# Patient Record
Sex: Female | Born: 1990 | Race: White | Hispanic: No | Marital: Single | Smoking: Current every day smoker
Health system: Southern US, Community
[De-identification: ages and names within clinical notes are randomized; demographics above are authoritative.]

## PROBLEM LIST (undated history)

## (undated) HISTORY — PX: TONSILLECTOMY: SUR1361

---
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
## 2010-07-20 ENCOUNTER — Emergency Department: Payer: Self-pay | Admitting: Emergency Medicine

## 2012-12-18 ENCOUNTER — Emergency Department: Payer: Self-pay | Admitting: Emergency Medicine

## 2013-01-01 ENCOUNTER — Emergency Department: Payer: Self-pay | Admitting: Emergency Medicine

## 2013-10-27 ENCOUNTER — Emergency Department: Payer: Self-pay | Admitting: Internal Medicine

## 2014-10-20 IMAGING — CR RIGHT FOOT COMPLETE - 3+ VIEW
1 series · 3 of 3 positions shown · non-contrast
Comparison: None.

CLINICAL DATA: Felt pop when landing on trampoline; unable to bear
weight on right foot. Right foot pain and swelling.

EXAM:
RIGHT FOOT COMPLETE - 3+ VIEW

[Series 1: x foot lat right · 0.14mm/px · 3 of 3 slices shown]
[im 1/3]
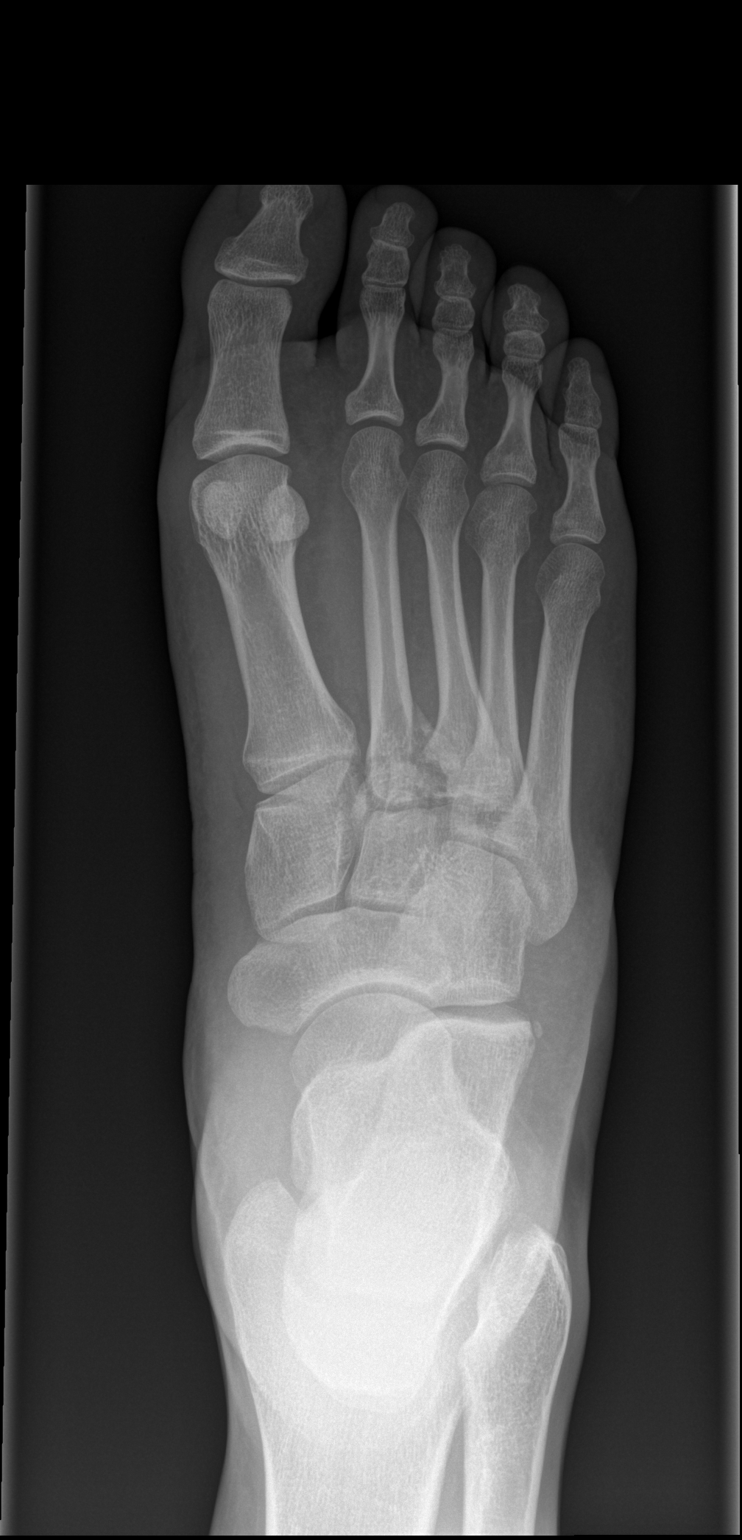
[im 2/3]
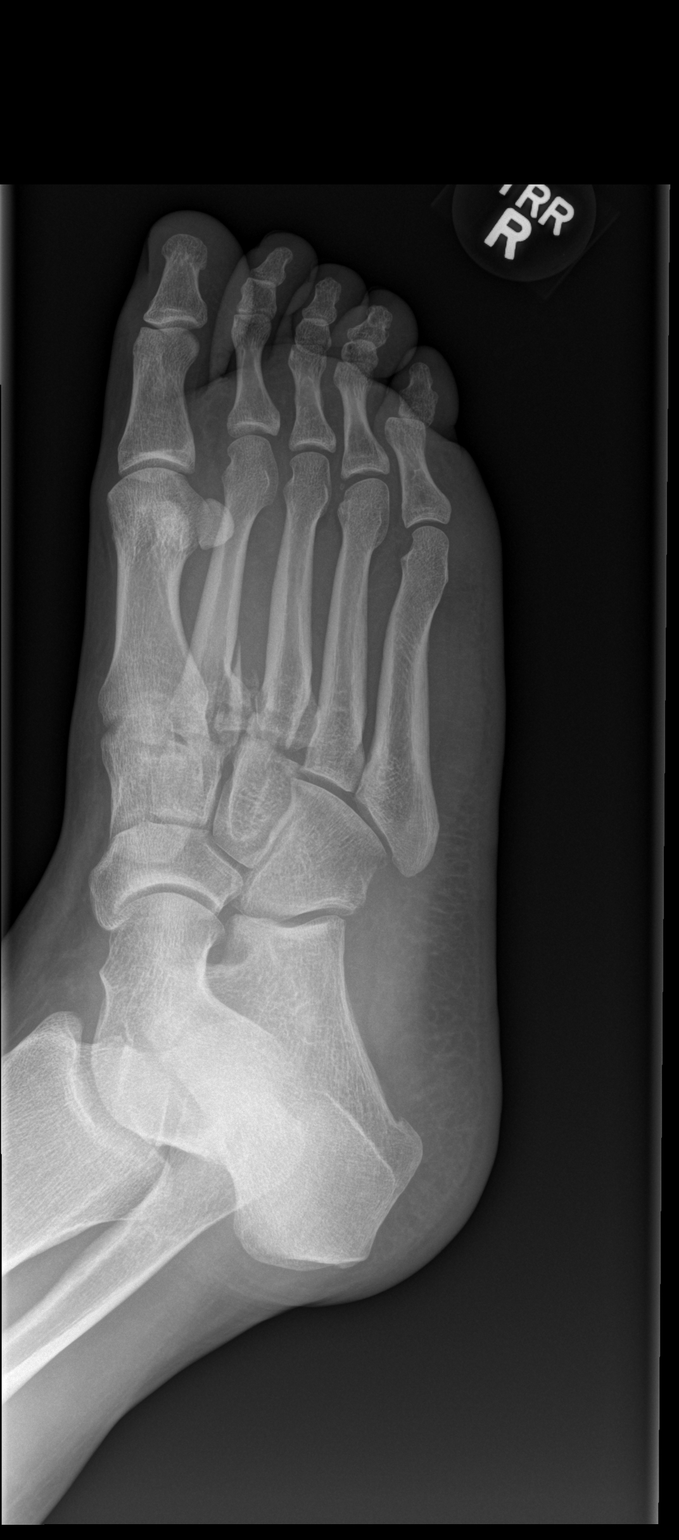
[im 3/3]
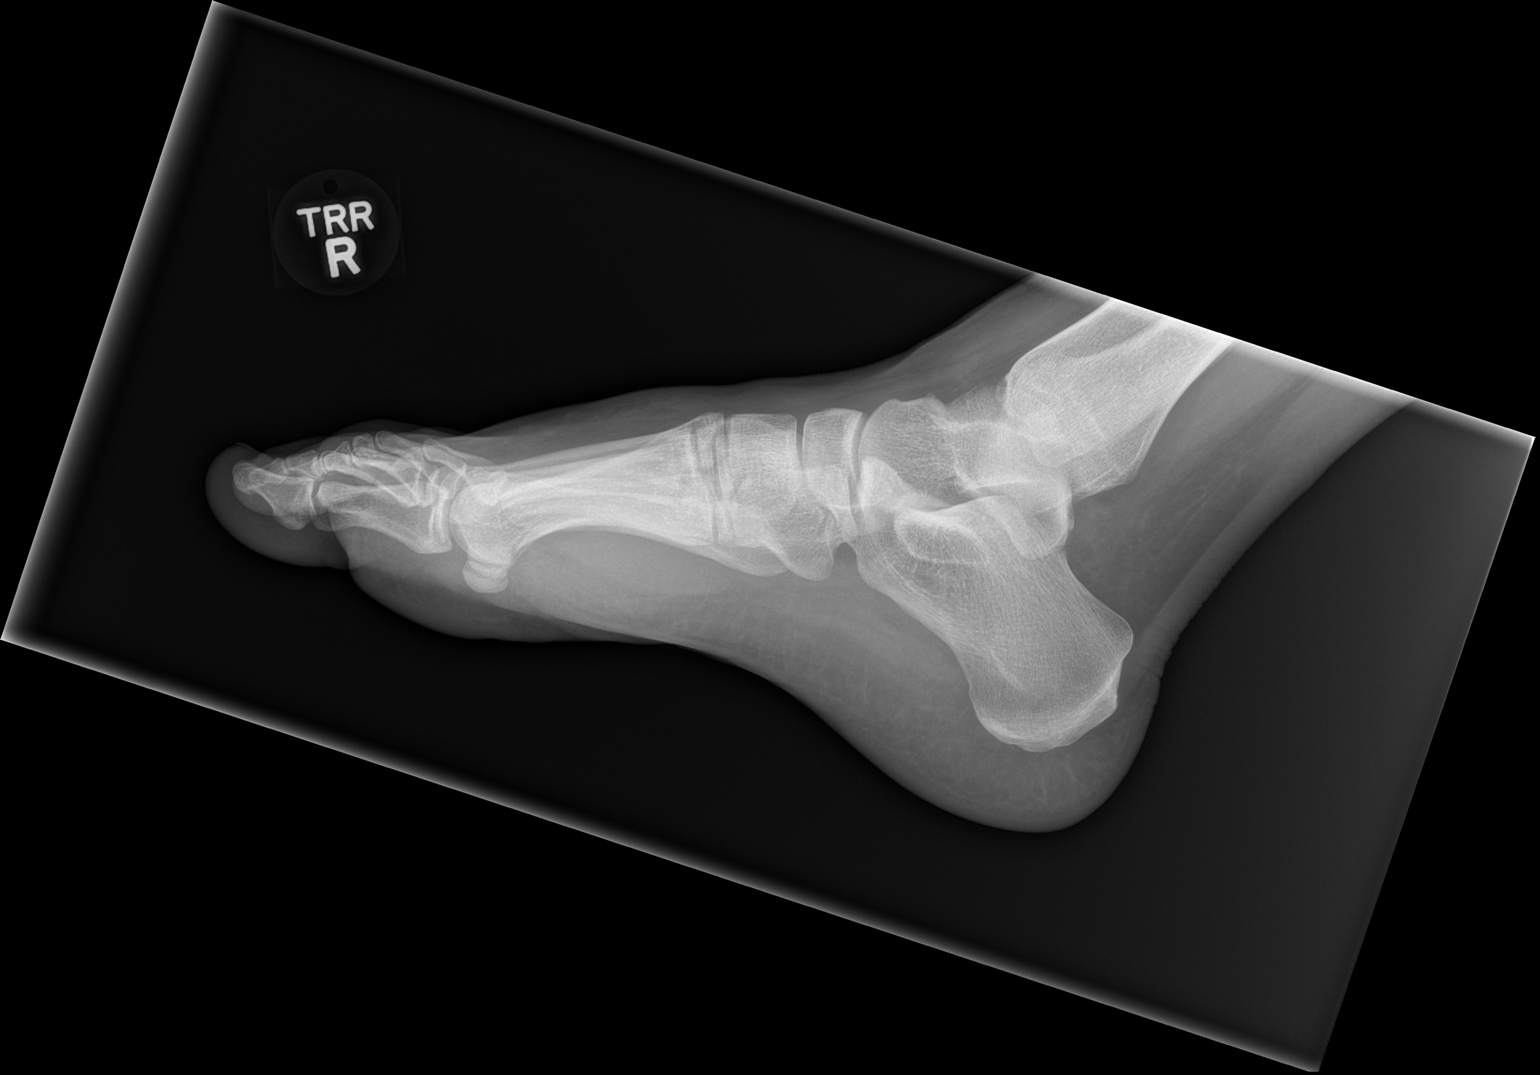

[3 of 3 positions shown; findings below may reference images not displayed]

FINDINGS: There is a mildly comminuted fracture involving the lateral aspect
of the base of the second metatarsal, with mild apparent widening of
the space between the second and third metatarsals, which could
reflect underlying Lisfranc injury. The fracture extends to the
underlying cuneiform. No additional fractures are seen. Visualized
joint spaces are otherwise preserved. Mild associated soft tissue
swelling is noted.
IMPRESSION: Mildly comminuted fracture involving the lateral aspect of the base
of the second metatarsal, with mild apparent widening of the space
between the second and third metatarsals, which could reflect
underlying Lisfranc injury. Intra-articular extension noted.

## 2016-05-19 ENCOUNTER — Emergency Department: Payer: Self-pay

## 2016-05-19 ENCOUNTER — Emergency Department
Admission: EM | Admit: 2016-05-19 | Discharge: 2016-05-19 | Disposition: A | Discharge status: Home / Self Care | Payer: Self-pay | Attending: Emergency Medicine | Admitting: Emergency Medicine

## 2016-05-19 DIAGNOSIS — S82832A Other fracture of upper and lower end of left fibula, initial encounter for closed fracture: Secondary | ICD-10-CM | POA: Insufficient documentation

## 2016-05-19 DIAGNOSIS — Y999 Unspecified external cause status: Secondary | ICD-10-CM | POA: Insufficient documentation

## 2016-05-19 DIAGNOSIS — W010XXA Fall on same level from slipping, tripping and stumbling without subsequent striking against object, initial encounter: Secondary | ICD-10-CM | POA: Insufficient documentation

## 2016-05-19 DIAGNOSIS — M25572 Pain in left ankle and joints of left foot: Secondary | ICD-10-CM

## 2016-05-19 DIAGNOSIS — Y929 Unspecified place or not applicable: Secondary | ICD-10-CM | POA: Insufficient documentation

## 2016-05-19 DIAGNOSIS — Y939 Activity, unspecified: Secondary | ICD-10-CM | POA: Insufficient documentation

## 2016-05-19 DIAGNOSIS — S92192A Other fracture of left talus, initial encounter for closed fracture: Secondary | ICD-10-CM | POA: Insufficient documentation

## 2016-05-19 MED ORDER — OXYCODONE-ACETAMINOPHEN 5-325 MG PO TABS: 1.0000 | ORAL_TABLET | ORAL | 0 refills | Status: DC | PRN

## 2016-05-19 MED ORDER — OXYCODONE-ACETAMINOPHEN 5-325 MG PO TABS
1.0000 | ORAL_TABLET | Freq: Once | ORAL | Status: AC
  Administered 2016-05-19: 1 via ORAL
  Filled 2016-05-19: qty 1

## 2016-05-19 NOTE — ED Triage Notes (Signed)
Pt states she fell last night injuring her left ankle. Swelling noted.

## 2016-05-19 NOTE — ED Provider Notes (Signed)
[facility] Emergency Department Provider Note  ____________________________________________  Time seen: Approximately 1:48 PM  I have reviewed the triage vital signs and the nursing notes.   HISTORY  Chief Complaint Ankle Pain    HPI Felicia Harris is a 25 y.o. female presents for evaluation of left ankle. Patient states that she fell last night injuring left ankle with swelling. Continues to have pain and left ankle difficult ambulate or walk. Describes the pain as 9/10.   History reviewed. No pertinent past medical history.  There are no active problems to display for this patient.   History reviewed. No pertinent surgical history.  Prior to Admission medications   Medication Sig Start Date End Date Taking? Authorizing Provider  oxyCODONE-acetaminophen (ROXICET) 5-325 MG tablet Take 1-2 tablets by mouth every 4 (four) hours as needed for severe pain. 05/19/16   Evangeline Dakinharles M Beadie Matsunaga, PA-C    Allergies Review of patient's allergies indicates no known allergies.  No family history on file.  Social History Social History  Substance Use Topics  . Smoking status: Never Smoker  . Smokeless tobacco: Not on file  . Alcohol use No    Review of Systems Constitutional: No fever/chills Cardiovascular: Denies chest pain. Respiratory: Denies shortness of breath. Gastrointestinal: No abdominal pain.  No nausea, no vomiting.  No diarrhea.  No constipation. Genitourinary: Negative for dysuria. Musculoskeletal: Positive for left ankle injury. Skin: Negative for rash. Neurological: Negative for headaches, focal weakness or numbness.  10-point ROS otherwise negative.  ____________________________________________   PHYSICAL EXAM: Ht 5\' 7"  (1.702 m)   Wt 106.6 kg   LMP 05/12/2016   BMI 36.81 kg/m   VITAL SIGNS: ED Triage Vitals  Enc Vitals Group     BP      Pulse      Resp      Temp      Temp src      SpO2      Weight      Height      Head  Circumference      Peak Flow      Pain Score      Pain Loc      Pain Edu?      Excl. in GC?     Constitutional: Alert and oriented. Well appearing and in no acute distress.  Cardiovascular: Normal rate, regular rhythm. Grossly normal heart sounds.  Good peripheral circulation. Respiratory: Normal respiratory effort.  No retractions. Lungs CTAB. Musculoskeletal: Positive for ankle injury. Left ankle swelling and edema and tenderness. Distally neurovascularly intact. Neurologic:  Normal speech and language. No gross focal neurologic deficits are appreciated. No gait instability. Skin:  Skin is warm, dry and intact. No rash noted. Psychiatric: Mood and affect are normal. Speech and behavior are normal.  ____________________________________________   LABS (all labs ordered are listed, but only abnormal results are displayed)  Labs Reviewed - No data to display ____________________________________________  EKG   ____________________________________________  RADIOLOGY   ____________________________________________   PROCEDURES  Procedure(s) performed: None  Critical Care performed: No  ____________________________________________   INITIAL IMPRESSION / ASSESSMENT AND PLAN / ED COURSE  Pertinent labs & imaging results that were available during my care of the patient were reviewed by me and considered in my medical decision making (see chart for details). Review of the [place] CSRS was performed in accordance of the [facility] prior to dispensing any controlled drugs.  Left ankle fracture.  Clinical Course    ____________________________________________   FINAL CLINICAL IMPRESSION(S) /  ED DIAGNOSES  Final diagnoses:  Left ankle pain     This chart was dictated using voice recognition software/Dragon. Despite best efforts to proofread, errors can occur which can change the meaning. Any change was purely unintentional.    Evangeline Dakinharles M Aarian Cleaver, PA-C 05/19/16 1443     Governor Rooksebecca Lord, MD 05/19/16 1620

## 2016-06-21 ENCOUNTER — Emergency Department
Admission: EM | Admit: 2016-06-21 | Discharge: 2016-06-21 | Disposition: A | Discharge status: Home / Self Care | Payer: Self-pay | Attending: Emergency Medicine | Admitting: Emergency Medicine

## 2016-06-21 ENCOUNTER — Encounter: Payer: Self-pay | Admitting: Emergency Medicine

## 2016-06-21 DIAGNOSIS — J029 Acute pharyngitis, unspecified: Secondary | ICD-10-CM | POA: Insufficient documentation

## 2016-06-21 DIAGNOSIS — Z791 Long term (current) use of non-steroidal anti-inflammatories (NSAID): Secondary | ICD-10-CM | POA: Insufficient documentation

## 2016-06-21 LAB — POCT RAPID STREP A: Streptococcus, Group A Screen (Direct): NEGATIVE

## 2016-06-21 MED ORDER — DEXAMETHASONE SODIUM PHOSPHATE 10 MG/ML IJ SOLN
INTRAMUSCULAR | Status: AC
  Filled 2016-06-21: qty 1

## 2016-06-21 MED ORDER — AMOXICILLIN 875 MG PO TABS: 875.0000 mg | ORAL_TABLET | Freq: Two times a day (BID) | ORAL | 0 refills | Status: DC

## 2016-06-21 MED ORDER — DEXAMETHASONE SODIUM PHOSPHATE 10 MG/ML IJ SOLN
10.0000 mg | Freq: Once | INTRAMUSCULAR | Status: AC
  Administered 2016-06-21: 10 mg via INTRAMUSCULAR

## 2016-06-21 MED ORDER — DEXAMETHASONE 2 MG PO TABS: ORAL_TABLET | ORAL | 0 refills | Status: DC

## 2016-06-21 NOTE — Discharge Instructions (Signed)
May take Tylenol as needed for pain while on Decadron.

## 2016-06-21 NOTE — ED Provider Notes (Signed)
[facility] Emergency Department Provider Note  ____________________________________________  Time seen: Approximately 12:37 PM  I have reviewed the triage vital signs and the nursing notes.   HISTORY  Chief Complaint Sore Throat    HPI Felicia Harris is a 25 y.o. female , NAD, presents to the emergency department today history of swollen lymph nodes and sore throat. Has had gradual worsening of sore throat and increasing swelling of lymph nodes in the last couple days. Has not had any fevers, chills, body aches. States she has "the worst sinuses ever" and has not noted any worsening of sinus pressure, headaches or congestion since the onset sore throat. Denies any sick contacts. Has not taken anything over-the-counter that has alleviated her symptoms. Denies any difficulty swallowing other than it is painful. Denies any difficulty breathing or swelling about the lips/tongue/throat. Has not had abdominal pain, nausea, vomiting nor any rashes.   History reviewed. No pertinent past medical history.  There are no active problems to display for this patient.   History reviewed. No pertinent surgical history.  Prior to Admission medications   Medication Sig Start Date End Date Taking? Authorizing Provider  amoxicillin (AMOXIL) 875 MG tablet Take 1 tablet (875 mg total) by mouth 2 (two) times daily. 06/21/16   Narcissa Melder L Rogerick Baldwin, PA-C  dexamethasone (DECADRON) 2 MG tablet Take 6 tablets on Day 1 with food, then decrease by 1 tablet daily until finished (6,5,4,3,2,1) 06/21/16   Judi Jaffe L Rilynn Habel, PA-C  oxyCODONE-acetaminophen (ROXICET) 5-325 MG tablet Take 1-2 tablets by mouth every 4 (four) hours as needed for severe pain. 05/19/16   Evangeline Dakin, PA-C    Allergies Review of patient's allergies indicates no known allergies.  No family history on file.  Social History Social History  Substance Use Topics  . Smoking status: Never Smoker  . Smokeless tobacco: Never Used   . Alcohol use No     Review of Systems  Constitutional: No fever/chills, fatigue ENT: Positive sore throat with lymph node swelling. The chronic nasal congestion, runny nose, sinus pressure. No ear pain, ear drainage, swelling about lips/tongue/throat. Cardiovascular: No chest pain. Respiratory: No cough, chest congestion. No shortness of breath. No wheezing.  Gastrointestinal: No abdominal pain.  No nausea, vomiting. Musculoskeletal: Negative for general myalgias, neck pain.  Skin: Negative for rash. Neurological: Negative for headaches. 10-point ROS otherwise negative.  ____________________________________________   PHYSICAL EXAM:  VITAL SIGNS: ED Triage Vitals  Enc Vitals Group     BP 06/21/16 1231 131/82     Pulse Rate 06/21/16 1231 89     Resp 06/21/16 1231 18     Temp 06/21/16 1231 98.4 F (36.9 C)     Temp Source 06/21/16 1231 Oral     SpO2 06/21/16 1231 98 %     Weight 06/21/16 1232 230 lb (104.3 kg)     Height 06/21/16 1232 5\' 7"  (1.702 m)     Head Circumference --      Peak Flow --      Pain Score 06/21/16 1232 10     Pain Loc --      Pain Edu? --      Excl. in GC? --      Constitutional: Alert and oriented. Well appearing and in no acute distress. Eyes: Conjunctivae are normal. PERRL. EOMI without pain.  Head: Atraumatic. ENT:      Ears: TMs visualized bilaterally with mild serous effusion but no erythema, bulging, perforation.      Nose: Moderate  congestion with moderate clear rhinorrhea.      Mouth/Throat: Mucous membranes are moist. Pharynx with moderate erythema but no swelling or exudate. Clear postnasal drip. Uvula is midline. Neck: No stridor, no carotid bruits. No cervical spine tenderness to palpation. Supple with full range of motion. No meningismus. Hematological/Lymphatic/Immunilogical: Positive bilateral anterior, focal cervical lymphadenopathy with tenderness to palpation but all are mobile. Cardiovascular: Normal rate, regular rhythm.  Normal S1 and S2.  Good peripheral circulation. Respiratory: Normal respiratory effort without tachypnea or retractions. Lungs CTAB with breath sounds noted in all lung fields. Neurologic:  Normal speech and language. No gross focal neurologic deficits are appreciated.  Skin:  Skin is warm, dry and intact. No rash noted. Psychiatric: Mood and affect are normal. Speech and behavior are normal. Patient exhibits appropriate insight and judgement.   ____________________________________________   LABS (all labs ordered are listed, but only abnormal results are displayed)  Labs Reviewed  POCT RAPID STREP A   ____________________________________________  EKG  None ____________________________________________  RADIOLOGY  None ____________________________________________    PROCEDURES  Procedure(s) performed: None   Procedures   Medications  dexamethasone (DECADRON) injection 10 mg (10 mg Intramuscular Given 06/21/16 1258)     ____________________________________________   INITIAL IMPRESSION / ASSESSMENT AND PLAN / ED COURSE  Pertinent labs & imaging results that were available during my care of the patient were reviewed by me and considered in my medical decision making (see chart for details).  Clinical Course    Patient's diagnosis is consistent with Acute pharyngitis. Patient will be discharged home with prescriptions for amoxicillin, Magic mouthwash with lidocaine and Decadron to take as directed. Patient may take over-the-counter Tylenol as seen for pain. [facility] if symptoms persist past this treatment course. Patient is given ED precautions to return to the ED for any worsening or new symptoms.    ____________________________________________  FINAL CLINICAL IMPRESSION(S) / ED DIAGNOSES  Final diagnoses:  Acute pharyngitis, unspecified pharyngitis type      NEW MEDICATIONS STARTED  DURING THIS VISIT:  Discharge Medication List as of 06/21/2016  1:07 PM    START taking these medications   Details  amoxicillin (AMOXIL) 875 MG tablet Take 1 tablet (875 mg total) by mouth 2 (two) times daily., Starting Sun 06/21/2016, Print    dexamethasone (DECADRON) 2 MG tablet Take 6 tablets on Day 1 with food, then decrease by 1 tablet daily until finished (6,5,4,3,2,1), Print             Hope PigeonJami L Frank Pilger, PA-C 06/21/16 1330    Sharman CheekPhillip Stafford, MD 06/23/16 2229

## 2016-06-21 NOTE — ED Triage Notes (Signed)
C/O sore throat x 2 days.  Sinus congestion noted.

## 2018-04-26 ENCOUNTER — Other Ambulatory Visit: Payer: Self-pay

## 2018-04-26 ENCOUNTER — Encounter: Payer: Self-pay | Admitting: Emergency Medicine

## 2018-04-26 DIAGNOSIS — N739 Female pelvic inflammatory disease, unspecified: Secondary | ICD-10-CM | POA: Insufficient documentation

## 2018-04-26 NOTE — ED Triage Notes (Signed)
Patient ambulatory to triage with steady gait, without difficulty or distress noted; pt st x 2 days having generalized abd pain with no accomp symptoms; denies hx of same

## 2018-04-27 ENCOUNTER — Emergency Department
Admission: EM | Admit: 2018-04-27 | Discharge: 2018-04-27 | Disposition: A | Discharge status: Home / Self Care | Payer: Self-pay | Attending: Emergency Medicine | Admitting: Emergency Medicine

## 2018-04-27 ENCOUNTER — Telehealth: Payer: Self-pay | Admitting: Emergency Medicine

## 2018-04-27 ENCOUNTER — Emergency Department: Payer: Self-pay

## 2018-04-27 DIAGNOSIS — N730 Acute parametritis and pelvic cellulitis: Secondary | ICD-10-CM

## 2018-04-27 LAB — COMPREHENSIVE METABOLIC PANEL
ALT: 24 U/L (ref 0–44)
AST: 24 U/L (ref 15–41)
Albumin: 4.2 g/dL (ref 3.5–5.0)
Alkaline Phosphatase: 78 U/L (ref 38–126)
Anion gap: 6 (ref 5–15)
BILIRUBIN TOTAL: 0.9 mg/dL (ref 0.3–1.2)
BUN: 13 mg/dL (ref 6–20)
CO2: 28 mmol/L (ref 22–32)
Calcium: 9.2 mg/dL (ref 8.9–10.3)
Chloride: 104 mmol/L (ref 98–111)
Creatinine, Ser: 0.88 mg/dL (ref 0.44–1.00)
GFR calc Af Amer: >60 mL/min (ref >60)
Glucose, Bld: 89 mg/dL (ref 70–99)
POTASSIUM: 4 mmol/L (ref 3.5–5.1)
Sodium: 138 mmol/L (ref 135–145)
TOTAL PROTEIN: 7.7 g/dL (ref 6.5–8.1)

## 2018-04-27 LAB — URINALYSIS, COMPLETE (UACMP) WITH MICROSCOPIC
BILIRUBIN URINE: NEGATIVE
Bacteria, UA: NONE SEEN
Glucose, UA: NEGATIVE mg/dL
Ketones, ur: 80 mg/dL — AB
NITRITE: NEGATIVE
PH: 5 (ref 5.0–8.0)
Protein, ur: NEGATIVE mg/dL
SPECIFIC GRAVITY, URINE: 1.027 (ref 1.005–1.030)

## 2018-04-27 LAB — CBC WITH DIFFERENTIAL/PLATELET
Basophils Absolute: 0.2 10*3/uL — ABNORMAL HIGH (ref 0–0.1)
Basophils Relative: 1 %
Eosinophils Absolute: 0.3 10*3/uL (ref 0–0.7)
Eosinophils Relative: 2 %
HEMATOCRIT: 44.1 % (ref 35.0–47.0)
Hemoglobin: 15.2 g/dL (ref 12.0–16.0)
LYMPHS PCT: 19 %
Lymphs Abs: 3.8 10*3/uL — ABNORMAL HIGH (ref 1.0–3.6)
MCH: 33 pg (ref 26.0–34.0)
MCHC: 34.5 g/dL (ref 32.0–36.0)
MCV: 95.6 fL (ref 80.0–100.0)
MONOS PCT: 6 %
Monocytes Absolute: 1.3 10*3/uL — ABNORMAL HIGH (ref 0.2–0.9)
NEUTROS ABS: 14.6 10*3/uL — AB (ref 1.4–6.5)
NEUTROS PCT: 72 %
Platelets: 395 10*3/uL (ref 150–440)
RBC: 4.61 MIL/uL (ref 3.80–5.20)
RDW: 12.8 % (ref 11.5–14.5)
WBC: 20.3 10*3/uL — AB (ref 3.6–11.0)

## 2018-04-27 LAB — WET PREP, GENITAL
Clue Cells Wet Prep HPF POC: NONE SEEN
SPERM: NONE SEEN
Trich, Wet Prep: NONE SEEN
YEAST WET PREP: NONE SEEN

## 2018-04-27 LAB — CHLAMYDIA/NGC RT PCR (ARMC ONLY)
Chlamydia Tr: DETECTED — AB
N gonorrhoeae: NOT DETECTED

## 2018-04-27 LAB — LIPASE, BLOOD: LIPASE: 23 U/L (ref 11–51)

## 2018-04-27 LAB — POCT PREGNANCY, URINE: Preg Test, Ur: NEGATIVE

## 2018-04-27 MED ORDER — CEFTRIAXONE SODIUM 250 MG IJ SOLR
250.0000 mg | Freq: Once | INTRAMUSCULAR | Status: AC
Start: 2018-04-27 — End: 2018-04-27
  Administered 2018-04-27: 250 mg via INTRAMUSCULAR
  Filled 2018-04-27: qty 250

## 2018-04-27 MED ORDER — DOXYCYCLINE HYCLATE 100 MG PO CAPS: 100.0000 mg | ORAL_CAPSULE | Freq: Two times a day (BID) | ORAL | 0 refills | Status: AC

## 2018-04-27 MED ORDER — SODIUM CHLORIDE 0.9 % IV BOLUS
1000.0000 mL | Freq: Once | INTRAVENOUS | Status: AC
  Administered 2018-04-27: 1000 mL via INTRAVENOUS

## 2018-04-27 MED ORDER — AZITHROMYCIN 500 MG PO TABS
1000.0000 mg | ORAL_TABLET | Freq: Once | ORAL | Status: AC
  Administered 2018-04-27: 1000 mg via ORAL
  Filled 2018-04-27: qty 2

## 2018-04-27 MED ORDER — IOPAMIDOL (ISOVUE-300) INJECTION 61%
100.0000 mL | Freq: Once | INTRAVENOUS | Status: AC | PRN
  Administered 2018-04-27: 100 mL via INTRAVENOUS

## 2018-04-27 MED ORDER — ONDANSETRON HCL 4 MG/2ML IJ SOLN
4.0000 mg | Freq: Once | INTRAMUSCULAR | Status: AC
  Administered 2018-04-27: 4 mg via INTRAVENOUS
  Filled 2018-04-27: qty 2

## 2018-04-27 MED ORDER — MORPHINE SULFATE (PF) 2 MG/ML IV SOLN
2.0000 mg | Freq: Once | INTRAVENOUS | Status: AC
  Administered 2018-04-27: 2 mg via INTRAVENOUS
  Filled 2018-04-27: qty 1

## 2018-04-27 NOTE — ED Provider Notes (Signed)
[facility] Emergency Department Provider Note ____________   First MD Initiated Contact with Patient 04/27/18 0202     (approximate)  I have reviewed the triage vital signs and the nursing notes.   HISTORY  Chief Complaint Abdominal Pain   HPI Felicia Harris is a 27 y.o. female presents with 2-day history of low abdominal discomfort.  Patient denies any dysuria no frequency urgency.  Patient denies any nausea or vomiting.  Patient denies any constipation.  Patient denies any fever afebrile on presentation temperature 98.8.  Past medical history None There are no active problems to display for this patient.   Past Surgical history None  Prior to Admission medications   Medication Sig Start Date End Date Taking? Authorizing Provider  amoxicillin (AMOXIL) 875 MG tablet Take 1 tablet (875 mg total) by mouth 2 (two) times daily. 06/21/16   Hagler, Jami L, PA-C  dexamethasone (DECADRON) 2 MG tablet Take 6 tablets on Day 1 with food, then decrease by 1 tablet daily until finished (6,5,4,3,2,1) 06/21/16   Hagler, Jami L, PA-C  oxyCODONE-acetaminophen (ROXICET) 5-325 MG tablet Take 1-2 tablets by mouth every 4 (four) hours as needed for severe pain. 05/19/16   Beers, Charmayne Sheer, PA-C    Allergies Bee venom  No family history on file.  Social History Social History   Tobacco Use  . Smoking status: Never Smoker  . Smokeless tobacco: Never Used  Substance Use Topics  . Alcohol use: No  . Drug use: Not on file    Review of Systems Constitutional: No fever/chills Eyes: No visual changes. ENT: No sore throat. Cardiovascular: Denies chest pain. Respiratory: Denies shortness of breath. Gastrointestinal: No abdominal pain.  No nausea, no vomiting.  No diarrhea.  No constipation. Genitourinary: Negative for dysuria. Musculoskeletal: Negative for neck pain.  Negative for back pain. Integumentary: Negative for rash. Neurological: Negative for headaches, focal  weakness or numbness.   ____________________________________________   PHYSICAL EXAM:  VITAL SIGNS: ED Triage Vitals  Enc Vitals Group     BP 04/26/18 2342 135/90     Pulse Rate 04/26/18 2342 100     Resp 04/26/18 2342 18     Temp 04/26/18 2342 98.8 F (37.1 C)     Temp Source 04/26/18 2342 Oral     SpO2 04/26/18 2342 100 %     Weight 04/26/18 2340 106.6 kg (235 lb)     Height 04/26/18 2340 1.702 m (5\' 7" )     Head Circumference --      Peak Flow --      Pain Score 04/26/18 2340 8     Pain Loc --      Pain Edu? --      Excl. in GC? --     Constitutional: Alert and oriented. Well appearing and in no acute distress. Eyes: Conjunctivae are normal.  Head: Atraumatic. Mouth/Throat: Mucous membranes are moist.  Oropharynx non-erythematous. Neck: No stridor.   Cardiovascular: Normal rate, regular rhythm. Good peripheral circulation. Grossly normal heart sounds. Respiratory: Normal respiratory effort.  No retractions. Lungs CTAB. Gastrointestinal: Soft and nontender. No distention.  Genitourinary: Positive for yellowish-white vaginal discharge.  Positive for cervical motion tenderness Musculoskeletal: No lower extremity tenderness nor edema. No gross deformities of extremities. Neurologic:  Normal speech and language. No gross focal neurologic deficits are appreciated.  Skin:  Skin is warm, dry and intact. No rash noted. Psychiatric: Mood and affect are normal. Speech and behavior are normal.  ____________________________________________   LABS (all  labs ordered are listed, but only abnormal results are displayed)  Labs Reviewed  WET PREP, GENITAL - Abnormal; Notable for the following components:      Result Value   WBC, Wet Prep HPF POC MANY (*)    All other components within normal limits  CBC WITH DIFFERENTIAL/PLATELET - Abnormal; Notable for the following components:   WBC 20.3 (*)    Neutro Abs 14.6 (*)    Lymphs Abs 3.8 (*)    Monocytes Absolute 1.3 (*)     Basophils Absolute 0.2 (*)    All other components within normal limits  URINALYSIS, COMPLETE (UACMP) WITH MICROSCOPIC - Abnormal; Notable for the following components:   Color, Urine YELLOW (*)    APPearance HAZY (*)    Hgb urine dipstick SMALL (*)    Ketones, ur 80 (*)    Leukocytes, UA TRACE (*)    All other components within normal limits  CHLAMYDIA/NGC RT PCR ([facility] ONLY)  COMPREHENSIVE METABOLIC PANEL  LIPASE, BLOOD  POCT PREGNANCY, URINE     RADIOLOGY I, Wagoner N Olen Eaves, personally viewed and evaluated these images (plain radiographs) as part of my medical decision making, as well as reviewing the written report by the radiologist.  ED MD interpretation: No acute intra-abdominal process noted on CT abdomen pelvis.  Official radiology report(s): Ct Abdomen Pelvis W Contrast  Result Date: 04/27/2018 CLINICAL DATA:  Two day history of generalized abdominal pain. Right lower quadrant pain. No accompanying symptoms. EXAM: CT ABDOMEN AND PELVIS WITH CONTRAST TECHNIQUE: Multidetector CT imaging of the abdomen and pelvis was performed using the standard protocol following bolus administration of intravenous contrast. CONTRAST:  ISOVUE-300 IOPAMIDOL (ISOVUE-300) INJECTION 61% COMPARISON:  None. FINDINGS: Lower chest: Lung bases are clear. Hepatobiliary: No focal liver abnormality is seen. No gallstones, gallbladder wall thickening, or biliary dilatation. Pancreas: Unremarkable. No pancreatic ductal dilatation or surrounding inflammatory changes. Spleen: Normal in size without focal abnormality. Adrenals/Urinary Tract: Adrenal glands are unremarkable. Kidneys are normal, without renal calculi, focal lesion, or hydronephrosis. Bladder is unremarkable. Stomach/Bowel: Stomach, small bowel, and colon are not abnormally distended. No wall thickening or inflammatory infiltration is appreciated. The appendix is normal. Vascular/Lymphatic: No significant vascular findings are present. No  enlarged abdominal or pelvic lymph nodes. Reproductive: Uterus and bilateral adnexa are unremarkable. Other: No abdominal wall hernia or abnormality. No abdominopelvic ascites. Musculoskeletal: No acute or significant osseous findings. IMPRESSION: No acute process demonstrated in the abdomen or pelvis. No evidence of bowel obstruction or inflammation. Electronically Signed   By: Burman Nieves M.D.   On: 04/27/2018 03:12      Procedures   ____________________________________________   INITIAL IMPRESSION / ASSESSMENT AND PLAN / ED COURSE  As part of my medical decision making, I reviewed the following data within the electronic MEDICAL RECORD NUMBER  27 year old female presenting with above-stated history and physical exam secondary to abdominal pain.  Concern for possible appendicitis, PID patient's CAT scan revealed no acute intra-abdominal pathology.  Of note patient's white blood cell count 20.  Physical exam consistent with PID.  Patient states last sexual intercourse was week and a half ago unprotected.  Patient given ceftriaxone and azithromycin in the emergency department will be prescribed doxycycline for home. ____________________________________________  FINAL CLINICAL IMPRESSION(S) / ED DIAGNOSES  Final diagnoses:  PID (acute pelvic inflammatory disease)     MEDICATIONS GIVEN DURING THIS VISIT:  Medications  sodium chloride 0.9 % bolus 1,000 mL (1,000 mLs Intravenous New Bag/Given 04/27/18 0401)  sodium chloride 0.9 %  bolus 1,000 mL (1,000 mLs Intravenous New Bag/Given 04/27/18 0401)  cefTRIAXone (ROCEPHIN) injection 250 mg (has no administration in time range)  azithromycin (ZITHROMAX) tablet 1,000 mg (has no administration in time range)  morphine 2 MG/ML injection 2 mg (2 mg Intravenous Given 04/27/18 0211)  ondansetron (ZOFRAN) injection 4 mg (4 mg Intravenous Given 04/27/18 0210)  iopamidol (ISOVUE-300) 61 % injection 100 mL (100 mLs Intravenous Contrast Given 04/27/18 0246)      ED Discharge Orders    None       Note:  This document was prepared using Dragon voice recognition software and may include unintentional dictation errors.    Darci CurrentBrown, [place] N, MD 04/27/18 [phone]

## 2018-04-27 NOTE — Telephone Encounter (Signed)
Called patient to inform of std results.  No answer and no voicemail.  Will send letter.  Patient was treated in the ED.

## 2018-09-08 ENCOUNTER — Other Ambulatory Visit: Payer: Self-pay

## 2018-09-08 ENCOUNTER — Emergency Department
Admission: EM | Admit: 2018-09-08 | Discharge: 2018-09-08 | Disposition: A | Discharge status: Home / Self Care | Payer: Self-pay | Attending: Emergency Medicine | Admitting: Emergency Medicine

## 2018-09-08 ENCOUNTER — Emergency Department: Payer: Self-pay

## 2018-09-08 ENCOUNTER — Encounter: Payer: Self-pay | Admitting: *Deleted

## 2018-09-08 DIAGNOSIS — S82121A Displaced fracture of lateral condyle of right tibia, initial encounter for closed fracture: Secondary | ICD-10-CM | POA: Insufficient documentation

## 2018-09-08 DIAGNOSIS — S83421A Sprain of lateral collateral ligament of right knee, initial encounter: Secondary | ICD-10-CM | POA: Insufficient documentation

## 2018-09-08 DIAGNOSIS — Y929 Unspecified place or not applicable: Secondary | ICD-10-CM | POA: Insufficient documentation

## 2018-09-08 DIAGNOSIS — X500XXA Overexertion from strenuous movement or load, initial encounter: Secondary | ICD-10-CM | POA: Insufficient documentation

## 2018-09-08 DIAGNOSIS — Y939 Activity, unspecified: Secondary | ICD-10-CM | POA: Insufficient documentation

## 2018-09-08 DIAGNOSIS — F17200 Nicotine dependence, unspecified, uncomplicated: Secondary | ICD-10-CM | POA: Insufficient documentation

## 2018-09-08 DIAGNOSIS — Y999 Unspecified external cause status: Secondary | ICD-10-CM | POA: Insufficient documentation

## 2018-09-08 MED ORDER — CYCLOBENZAPRINE HCL 10 MG PO TABS
10.0000 mg | ORAL_TABLET | Freq: Once | ORAL | Status: AC
  Administered 2018-09-08: 10 mg via ORAL
  Filled 2018-09-08: qty 1

## 2018-09-08 MED ORDER — NABUMETONE 750 MG PO TABS: 750.0000 mg | ORAL_TABLET | Freq: Two times a day (BID) | ORAL | 0 refills | Status: DC

## 2018-09-08 MED ORDER — NAPROXEN 500 MG PO TABS
500.0000 mg | ORAL_TABLET | Freq: Once | ORAL | Status: AC
  Administered 2018-09-08: 500 mg via ORAL
  Filled 2018-09-08: qty 1

## 2018-09-08 MED ORDER — CYCLOBENZAPRINE HCL 5 MG PO TABS: 5.0000 mg | ORAL_TABLET | Freq: Three times a day (TID) | ORAL | 0 refills | Status: DC | PRN

## 2018-09-08 NOTE — ED Notes (Signed)
See triage note. Pt states she fell a week ago and twisted her R knee inward and felt a pop/sharp pain. States it started to feel better but then last night it "locked up" and she twisted it again. 7/10 pain. Feels worse when straightened per pt. R leg visibly swollen, appropriate color, warmer than L knee. R dorsalis pedis pulse 2+.

## 2018-09-08 NOTE — ED Triage Notes (Signed)
Pt has right knee pain.  Pt fell last week and then twisted right knee last night.  Pt alert.

## 2018-09-08 NOTE — Discharge Instructions (Addendum)
Your x-ray is concerning for a possible avulsion fracture of the outside edge of the shin bone. Your will be placed in a splint and asked to follow-up with orthopedics. Take the prescription meds as directed. Rest, ice and elevate the knee when seated. Return to the ED as needed.

## 2018-09-08 NOTE — ED Notes (Signed)
No answer when pt called from lobby to be roomed x1.

## 2018-09-08 NOTE — ED Provider Notes (Signed)
[facility] Emergency Department Provider Note ____________________________________________  Time seen: 2300  I have reviewed the triage vital signs and the nursing notes.  HISTORY  Chief Complaint  Knee Pain  HPI Felicia Harris is a 27 y.o. female resents to the ED for evaluation of right knee pain and some disability.  Patient describes 2 separate incidents where she injured her right knee.  Initial occurred as a week when she describes a twisting injury to the right knee, heard to fall.  She twisted her knee again last night, and has had pain and stiffness to the knee since that time.  She localizes pain to the lateral and posterior aspects of the right knee.  She denies any history of ongoing or chronic knee problems.  She has been taking ibuprofen with limited benefit.  History reviewed. No pertinent past medical history.  There are no active problems to display for this patient.  History reviewed. No pertinent surgical history.  Prior to Admission medications   Medication Sig Start Date End Date Taking? Authorizing Provider  cyclobenzaprine (FLEXERIL) 5 MG tablet Take 1 tablet (5 mg total) by mouth 3 (three) times daily as needed for muscle spasms. 09/08/18   Rorik Vespa, Charlesetta IvoryJenise V Bacon, PA-C  nabumetone (RELAFEN) 750 MG tablet Take 1 tablet (750 mg total) by mouth 2 (two) times daily. 09/08/18   Dorianna Mckiver, Charlesetta IvoryJenise V Bacon, PA-C    Allergies Bee venom  No family history on file.  Social History Social History   Tobacco Use  . Smoking status: Current Every Day Smoker  . Smokeless tobacco: Never Used  Substance Use Topics  . Alcohol use: No  . Drug use: Not on file    Review of Systems  Constitutional: Negative for fever. Cardiovascular: Negative for chest pain. Respiratory: Negative for shortness of breath. Musculoskeletal: Negative for back pain.  Right knee pain as above. Skin: Negative for rash. Neurological: Negative for headaches, focal weakness  or numbness. ____________________________________________  PHYSICAL EXAM:  VITAL SIGNS: ED Triage Vitals  Enc Vitals Group     BP 09/08/18 2203 116/75     Pulse Rate 09/08/18 2203 (!) 102     Resp 09/08/18 2203 18     Temp 09/08/18 2203 98.4 F (36.9 C)     Temp Source 09/08/18 2203 Oral     SpO2 09/08/18 2203 99 %     Weight 09/08/18 2205 230 lb (104.3 kg)     Height 09/08/18 2205 5\' 7"  (1.702 m)     Head Circumference --      Peak Flow --      Pain Score 09/08/18 2204 8     Pain Loc --      Pain Edu? --      Excl. in [place]? --     Constitutional: Alert and oriented. Well appearing and in no distress. Head: Normocephalic and atraumatic. Eyes: Conjunctivae are normal. Normal extraocular movements Cardiovascular: Normal rate, regular rhythm. Normal distal pulses. Respiratory: Normal respiratory effort.  Musculoskeletal: Right knee without any obvious deformity, dislocation, or effusion.  Patient with normal active flexion extension range on exam.  No significant valgus or varus strain stress is appreciated.  Patient is mildly tender to palpation to the lateral aspect of the knee joint.  No patellar ballottement is appreciated.  Negative anterior/posterior drawer.  Nontender with normal range of motion in all extremities.  Neurologic: Antalgic gait without ataxia. Normal speech and language. No gross focal neurologic deficits are appreciated. Skin:  Skin is  warm, dry and intact. No rash noted. ____________________________________________   RADIOLOGY  Right Knee  IMPRESSION: Focal area of cortical irregularity along the lateral tibial plateau may represent an age indeterminate fracture. Correlation with clinical exam and point tenderness over this area recommended. ____________________________________________  PROCEDURES  Procedures Naproxen 500 mg PO Flexeril 10 mg PO Jones-Watson knee wrap ____________________________________________  INITIAL IMPRESSION / ASSESSMENT  AND PLAN / ED COURSE  Patient with ED evaluation of right knee pain after 2 separate injuries in the last week.  Patient's exam is overall benign for any acute internal derangement.  Her x-ray however, is concerning for a lateral defect to the tibial condyle.  Patient has some mild tenderness in the same region, therefore we will consider she may have a slight avulsion fracture to the collateral ligament.  Patient is placed in a Jones Watson wrap for comfort and support.  She is advised that she should follow-up with Ortho for further management of her knee pain.  Patient verbalized understanding.  She will be discharged with prescriptions for Relafen and Flexeril dose as directed.  She will follow-up with Ortho or return to the ED as needed. ____________________________________________  FINAL CLINICAL IMPRESSION(S) / ED DIAGNOSES  Final diagnoses:  Sprain of lateral collateral ligament of right knee, initial encounter  Avulsion fracture of lateral condyle of right tibia, closed, initial encounter      Lissa Hoard, PA-C 09/08/18 2347    Jeanmarie Plant, MD 09/09/18 1526

## 2018-11-21 ENCOUNTER — Ambulatory Visit: Payer: Self-pay | Admitting: Physician Assistant

## 2018-11-21 ENCOUNTER — Encounter: Payer: Self-pay | Admitting: Physician Assistant

## 2018-11-21 VITALS — BP 124/90 | HR 65 | Temp 98.5°F | Resp 12 | Ht 67.0 in | Wt 252.0 lb

## 2018-11-21 DIAGNOSIS — J029 Acute pharyngitis, unspecified: Secondary | ICD-10-CM

## 2018-11-21 DIAGNOSIS — J069 Acute upper respiratory infection, unspecified: Secondary | ICD-10-CM

## 2018-11-21 LAB — POCT RAPID STREP A (OFFICE): RAPID STREP A SCREEN: NEGATIVE

## 2018-11-21 MED ORDER — PSEUDOEPH-BROMPHEN-DM 30-2-10 MG/5ML PO SYRP: 5.0000 mL | ORAL_SOLUTION | Freq: Four times a day (QID) | ORAL | 0 refills | Status: AC | PRN

## 2018-11-21 MED ORDER — IPRATROPIUM BROMIDE 0.03 % NA SOLN: 2.0000 | Freq: Two times a day (BID) | NASAL | 0 refills | Status: AC

## 2018-11-21 MED ORDER — FLUTICASONE PROPIONATE 50 MCG/ACT NA SUSP: 2.0000 | Freq: Every day | NASAL | 0 refills | Status: AC

## 2018-11-21 NOTE — Progress Notes (Signed)
MRN: [id number] DOB: Oct 06, 1990  Subjective:   Felicia Harris is a 28 y.o. female presenting for chief complaint of Sore Throat (x last night) and Adenopathy (x last night) .  Reports 1 day history of sore throat, sneezing, nasal congestion, runny nose, and infrequent dry cough. Denies fever, sinus pain, ear fullness, inability to swallow, voice change, productive cough, wheezing, shortness of breath, chest pain and myalgia, fatigue, nausea, vomiting, abdominal pain and diarrhea. Has not tried anything for relief. No known sick contact exposure. Smokes 0.5 ppd. PMH of seasonal allergies. No PMH of asthma, DM, or HTN. PSH of tonsillectomy. Has not had strep since. No recent oral sex exposure.  Denies any ther aggravating or relieving factors, no other questions or concerns.  Review of Systems  Constitutional: Negative for diaphoresis.  Respiratory: Negative for hemoptysis and stridor.   Neurological: Negative for dizziness and headaches.    Felicia Harris has a current medication list which includes the following prescription(s): brompheniramine-pseudoephedrine-dm, fluticasone, and ipratropium. Also is allergic to bee venom.  Felicia Harris  has no past medical history on file. Also  has no past surgical history on file.   Objective:   Vitals: BP 124/90 (BP Location: Right Arm, Patient Position: Sitting, Cuff Size: Large)   Pulse 65   Temp 98.5 F (36.9 C)   Resp 12   Ht 5\' 7"  (1.702 m)   Wt 252 lb (114.3 kg)   LMP 11/14/2018   SpO2 99%   BMI 39.47 kg/m   Physical Exam Vitals signs reviewed.  Constitutional:      General: She is not in acute distress.    Appearance: She is well-developed. She is not ill-appearing or toxic-appearing.  HENT:     Head: Normocephalic and atraumatic.     Right Ear: Tympanic membrane, ear canal and external ear normal.     Left Ear: Tympanic membrane, ear canal and external ear normal.     Nose: Mucosal edema (moderate b/l R>L), congestion and rhinorrhea present.  Rhinorrhea is clear.     Right Sinus: No maxillary sinus tenderness or frontal sinus tenderness.     Left Sinus: No maxillary sinus tenderness or frontal sinus tenderness.     Mouth/Throat:     Lips: Pink.     Mouth: Mucous membranes are moist.     Pharynx: Uvula midline. Posterior oropharyngeal erythema (cobblestoning noted) present. No pharyngeal swelling, oropharyngeal exudate or uvula swelling.     Tonsils: Swelling: 0 on the right. 0 on the left.     Comments: S/p tonsillectomy Eyes:     Conjunctiva/sclera: Conjunctivae normal.  Neck:     Musculoskeletal: Full passive range of motion without pain and normal range of motion. No edema or neck rigidity.  Cardiovascular:     Rate and Rhythm: Normal rate and regular rhythm.     Heart sounds: Normal heart sounds.  Pulmonary:     Effort: Pulmonary effort is normal.     Breath sounds: Normal breath sounds. No decreased breath sounds, wheezing, rhonchi or rales.  Lymphadenopathy:     Head:     Right side of head: No submental, submandibular, tonsillar, preauricular, posterior auricular or occipital adenopathy.     Left side of head: No submental, submandibular, tonsillar, preauricular, posterior auricular or occipital adenopathy.     Cervical: No cervical adenopathy.     Upper Body:     Right upper body: No supraclavicular adenopathy.     Left upper body: No supraclavicular adenopathy.  Skin:    General:  Skin is warm and dry.  Neurological:     Mental Status: She is alert.     Results for orders placed or performed in visit on 11/21/18 (from the past 24 hour(s))  POCT rapid strep A     Status: Normal   Collection Time: 11/21/18 10:01 AM  Result Value Ref Range   Rapid Strep A Screen Negative Negative    Assessment and Plan :  1. Viral URI Patient is overall well-appearing, no acute distress.  VSS. POC strep negative.  History and physical exam are consistent with viral URI. Given educational material on viral URI.  Recommend  symptomatic treatment at this time.  Advised to contact office if sinus pressure develops and persists over the next 7 days. Could consider Rx for antibiotic at that time.  Advised to follow-up in office, with family doctor, or local urgent care if no improvement in other symptoms after 7 to 10 days.  Seek care sooner at local urgent care or ED if symptoms worsen/develop new concerning symptoms.  Patient voices understanding. - brompheniramine-pseudoephedrine-DM 30-2-10 MG/5ML syrup; Take 5 mLs by mouth 4 (four) times daily as needed.  Dispense: 120 mL; Refill: 0 - fluticasone (FLONASE) 50 MCG/ACT nasal spray; Place 2 sprays into both nostrils daily.  Dispense: 16 g; Refill: 0 - ipratropium (ATROVENT) 0.03 % nasal spray; Place 2 sprays into both nostrils 2 (two) times daily.  Dispense: 30 mL; Refill: 0  2. Sore throat - POCT rapid strep A  Felicia Core, PA-C  [facility] 11/21/2018 10:22 AM

## 2018-11-21 NOTE — Patient Instructions (Addendum)
Viral Respiratory Infection  -Start flonase nasal spray in the morning, atrovent nasal spray in the evening.        -Use bromfed throughout the day.       -Use ibuprofen 800mg  every 8 hours.        -Do this regimen for the next few days.    -After you stop this regimen, start daily zyrtec and continue flonase for the next couple weeks.     -If you start to have sinus pressure that does resolve after one week, please call here an dlet [place] know.  -If any of your symptoms worsen or you develop new concerning symptoms, seek care at local urgent care or ED.    A viral respiratory infection is an illness that affects parts of the body that are used for breathing. These include the lungs, nose, and throat. It is caused by a germ called a virus. Some examples of this kind of infection are:  A cold.  The flu (influenza).  A respiratory syncytial virus (RSV) infection. A person who gets this illness may have the following symptoms:  A stuffy or runny nose.  Yellow or green fluid in the nose.  A cough.  Sneezing.  Tiredness (fatigue).  Achy muscles.  A sore throat.  Sweating or chills.  A fever.  A headache. Follow these instructions at home: Managing pain and congestion  Take over-the-counter and prescription medicines only as told by your doctor.  If you have a sore throat, gargle with salt water. Do this 3-4 times per day or as needed. To make a salt-water mixture, dissolve -1 tsp of salt in 1 cup of warm water. Make sure that all the salt dissolves.  Use nose drops made from salt water. This helps with stuffiness (congestion). It also helps soften the skin around your nose.  Drink enough fluid to keep your pee (urine) pale yellow. General instructions   Rest as much as possible.  Do not drink alcohol.  Do not use any products that have nicotine or tobacco, such as cigarettes and e-cigarettes. If you need help quitting, ask your doctor.  Keep all follow-up visits  as told by your doctor. This is important. How is this prevented?   Get a flu shot every year. Ask your doctor when you should get your flu shot.  Do not let other people get your germs. If you are sick: ? Stay home from work or school. ? Wash your hands with soap and water often. Wash your hands after you cough or sneeze. If soap and water are not available, use hand sanitizer.  Avoid contact with people who are sick during cold and flu season. This is in fall and winter. Get help if:  Your symptoms last for 10 days or longer.  Your symptoms get worse over time.  You have a fever.  You have very bad pain in your face or forehead.  Parts of your jaw or neck become very swollen. Get help right away if:  You feel pain or pressure in your chest.  You have shortness of breath.  You faint or feel like you will faint.  You keep throwing up (vomiting).  You feel confused. Summary  A viral respiratory infection is an illness that affects parts of the body that are used for breathing.  Examples of this illness include a cold, the flu, and respiratory syncytial virus (RSV) infection.  The infection can cause a runny nose, cough, sneezing, sore throat, and fever.  Follow what your doctor tells you about taking medicines, drinking lots of fluid, washing your hands, resting at home, and avoiding people who are sick. This information is not intended to replace advice given to you by your health care provider. Make sure you discuss any questions you have with your health care provider. Document Released: 09/03/2008 Document Revised: 11/01/2017 Document Reviewed: 11/01/2017 Elsevier Interactive Patient Education  2019 [employer].

## 2018-11-23 ENCOUNTER — Telehealth: Payer: Self-pay | Admitting: Emergency Medicine

## 2018-11-23 NOTE — Telephone Encounter (Signed)
Spoke with patient whom stated that she is a little better. But still sick. I asked her was there anything else that we could do. She stated that she will continue to take medication prescribed and see how she will feel.

## 2019-04-20 IMAGING — CT CT ABD-PELV W/ CM
2 of 4 series · 16 of 46 positions shown, 18 images · IV contrast (APPLIED)
Comparison: None.

CLINICAL DATA: Two day history of generalized abdominal pain. Right
lower quadrant pain. No accompanying symptoms.

EXAM:
CT ABDOMEN AND PELVIS WITH CONTRAST
TECHNIQUE: Multidetector CT imaging of the abdomen and pelvis was performed
using the standard protocol following bolus administration of
intravenous contrast.
CONTRAST:  100mL KX1DEI-NFF IOPAMIDOL (KX1DEI-NFF) INJECTION 61%

[Series 2: routine abd/pel with · axial · 0.85mm/px · z∈[-833,-353]mm · 13 of 106 slices shown, 15 images]
[im 5/106  soft-tissue]
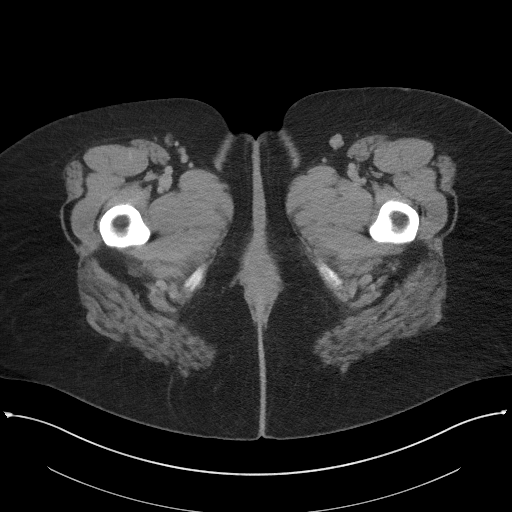
[im 5/106  bone]
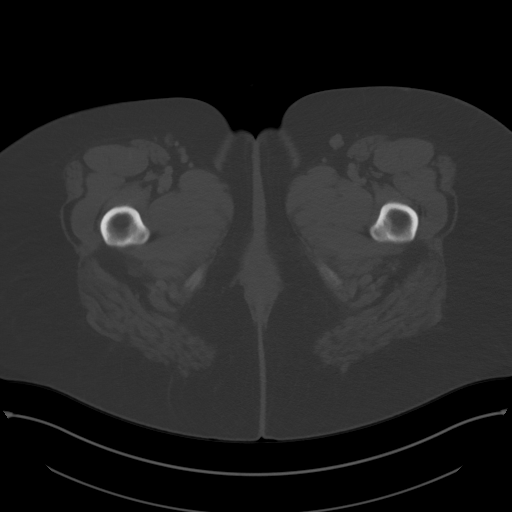
[im 13/106  soft-tissue]
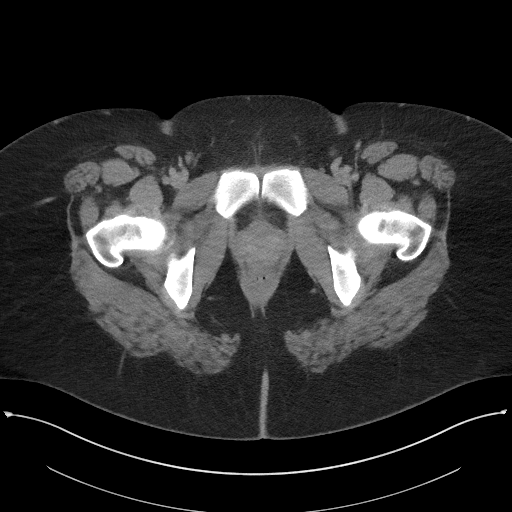
[im 22/106  soft-tissue]
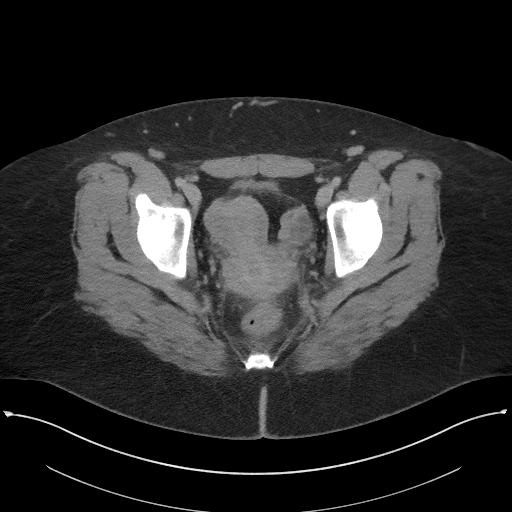
[im 30/106  soft-tissue]
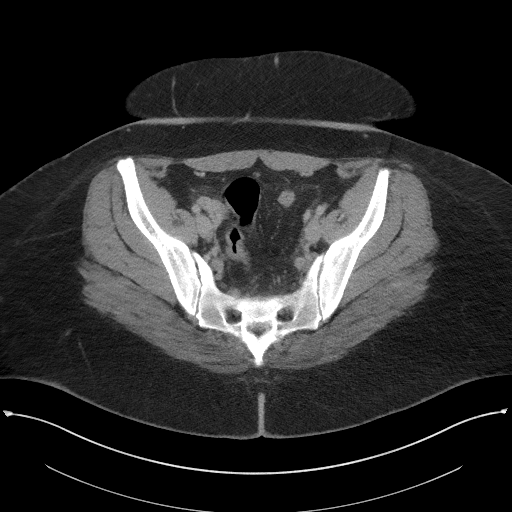
[im 38/106  soft-tissue]
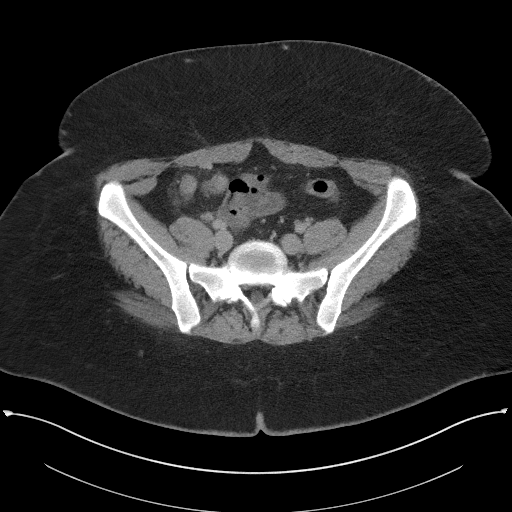
[im 47/106  soft-tissue]
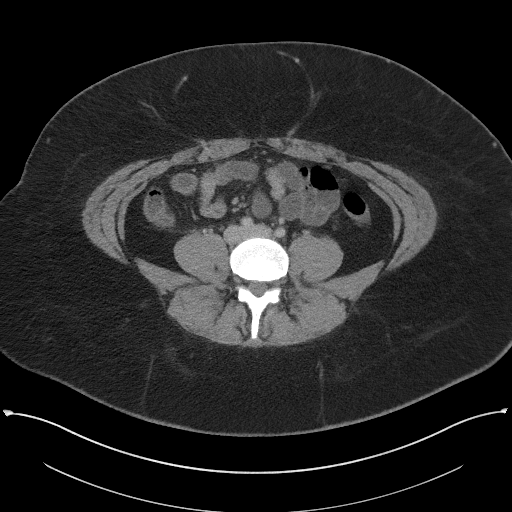
[im 55/106  soft-tissue]
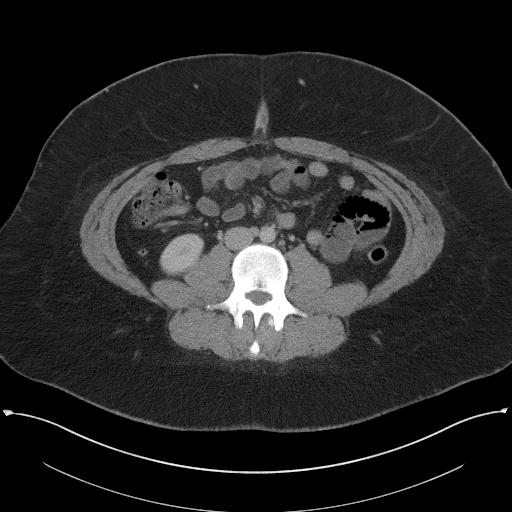
[im 59/106  soft-tissue]
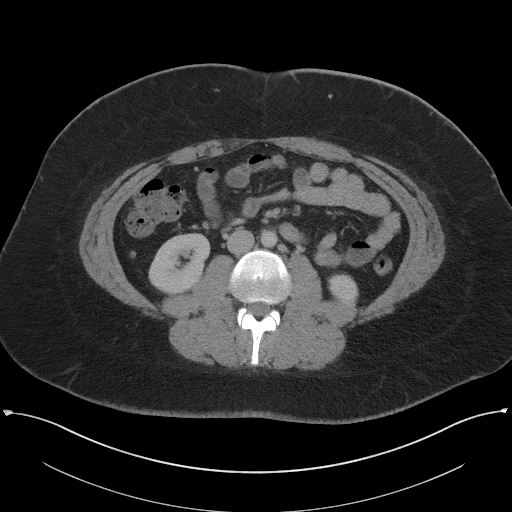
[im 68/106  soft-tissue]
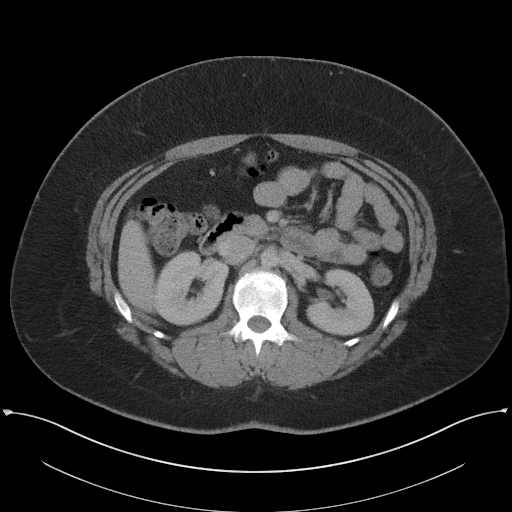
[im 68/106  bone]
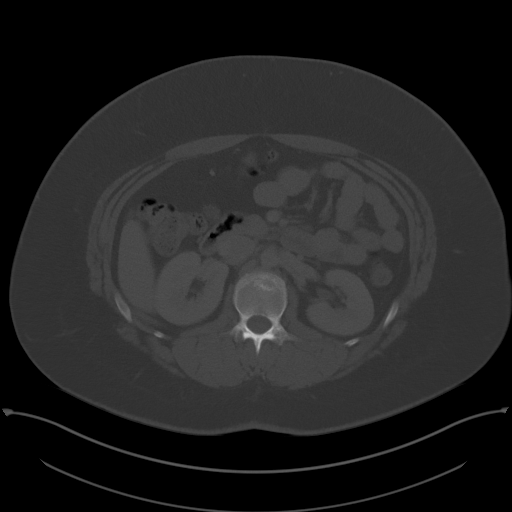
[im 76/106  soft-tissue]
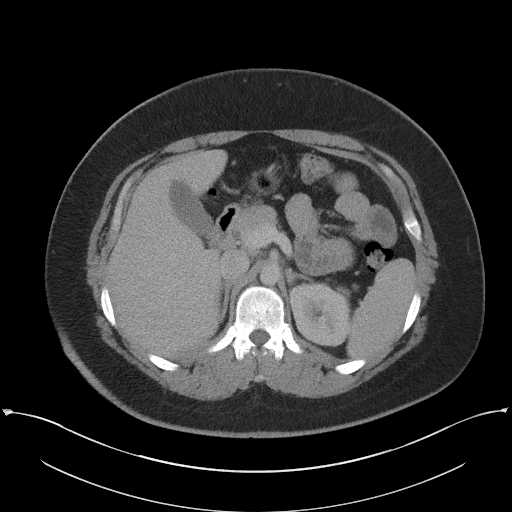
[im 85/106  soft-tissue]
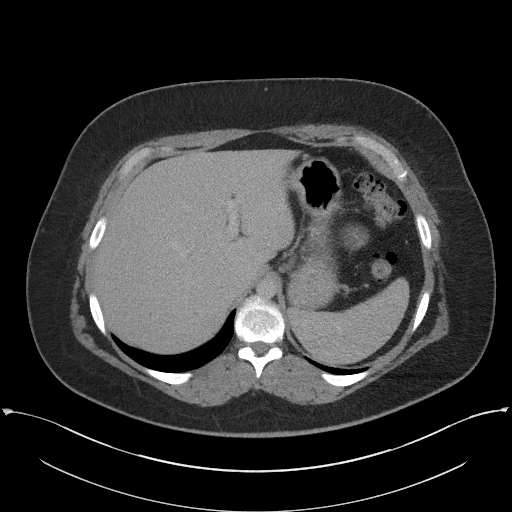
[im 93/106  soft-tissue]
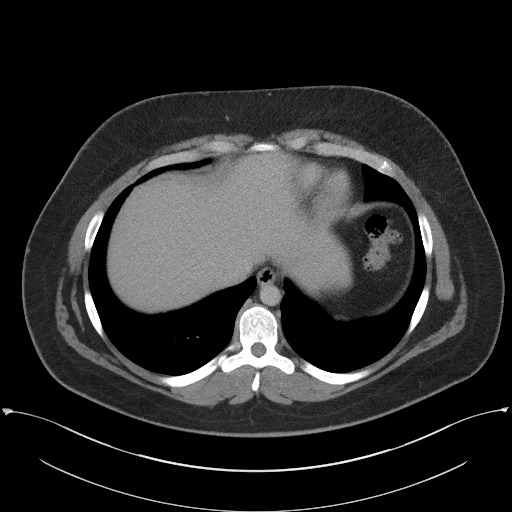
[im 101/106  soft-tissue]
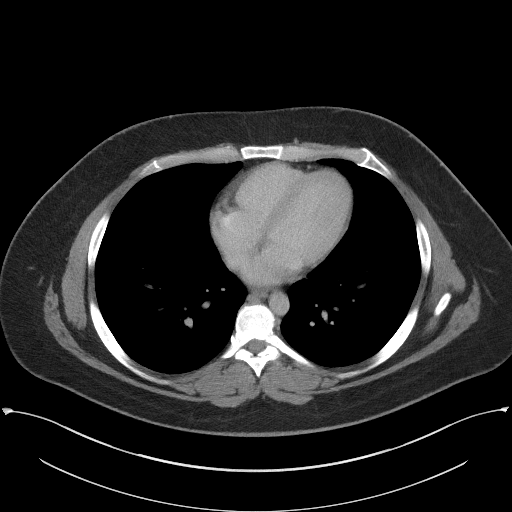

[Series 5: coronal st · coronal · 0.76mm/px · 3 of 80 slices shown]
[im 27/80  soft-tissue]
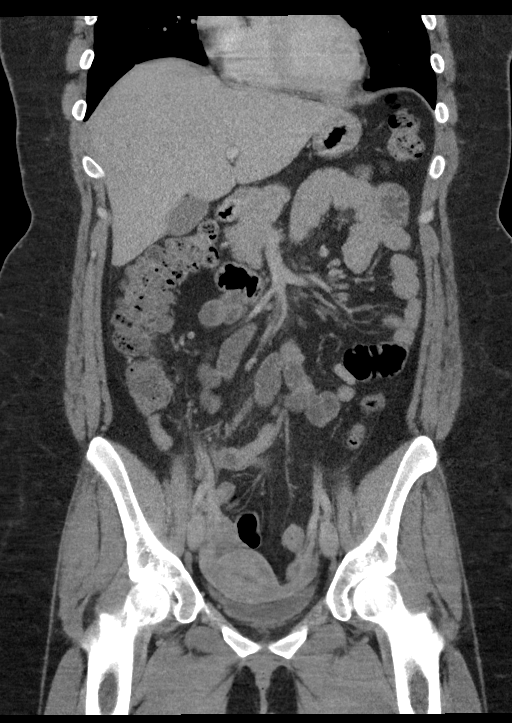
[im 36/80  soft-tissue]
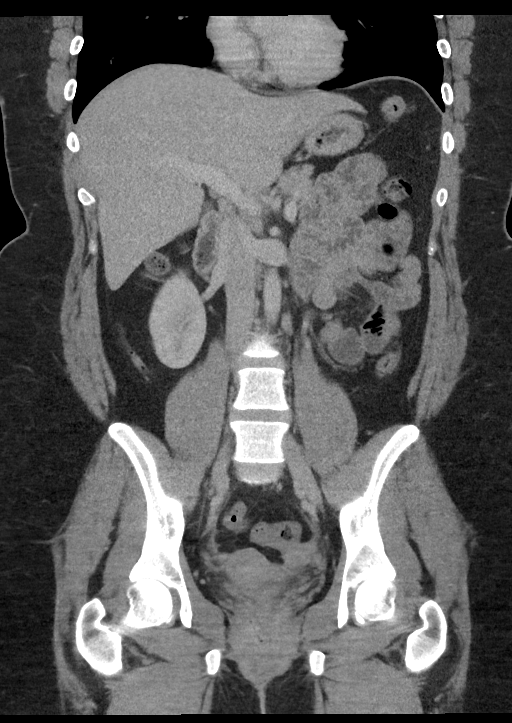
[im 44/80  soft-tissue]
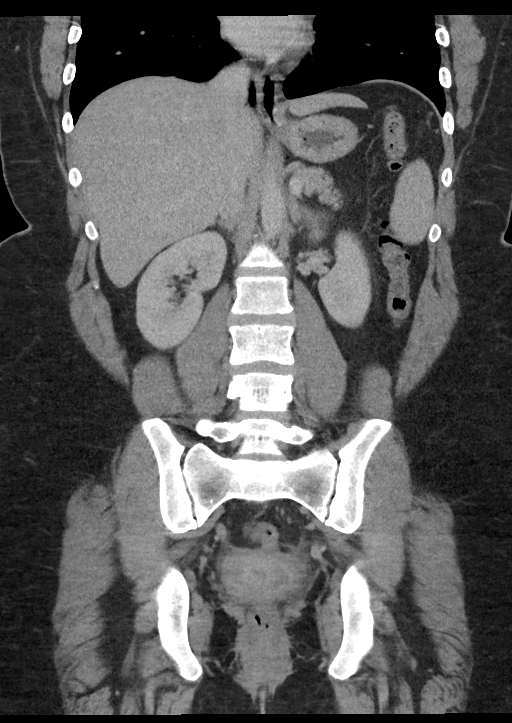

[16 of 46 positions shown; findings below may reference images not displayed]

FINDINGS: Lower chest: Lung bases are clear.

Hepatobiliary: No focal liver abnormality is seen. No gallstones,
gallbladder wall thickening, or biliary dilatation.

Pancreas: Unremarkable. No pancreatic ductal dilatation or
surrounding inflammatory changes.

Spleen: Normal in size without focal abnormality.

Adrenals/Urinary Tract: Adrenal glands are unremarkable. Kidneys are
normal, without renal calculi, focal lesion, or hydronephrosis.
Bladder is unremarkable.

Stomach/Bowel: Stomach, small bowel, and colon are not abnormally
distended. No wall thickening or inflammatory infiltration is
appreciated. The appendix is normal.

Vascular/Lymphatic: No significant vascular findings are present. No
enlarged abdominal or pelvic lymph nodes.

Reproductive: Uterus and bilateral adnexa are unremarkable.

Other: No abdominal wall hernia or abnormality. No abdominopelvic
ascites.

Musculoskeletal: No acute or significant osseous findings.
IMPRESSION: No acute process demonstrated in the abdomen or pelvis. No evidence
of bowel obstruction or inflammation.

## 2019-04-21 ENCOUNTER — Other Ambulatory Visit: Payer: Self-pay

## 2019-04-21 ENCOUNTER — Emergency Department
Admission: EM | Admit: 2019-04-21 | Discharge: 2019-04-21 | Disposition: A | Discharge status: Home / Self Care | Payer: Self-pay | Attending: Emergency Medicine | Admitting: Emergency Medicine

## 2019-04-21 ENCOUNTER — Encounter: Payer: Self-pay | Admitting: Emergency Medicine

## 2019-04-21 DIAGNOSIS — Z20828 Contact with and (suspected) exposure to other viral communicable diseases: Secondary | ICD-10-CM | POA: Insufficient documentation

## 2019-04-21 DIAGNOSIS — J029 Acute pharyngitis, unspecified: Secondary | ICD-10-CM | POA: Insufficient documentation

## 2019-04-21 DIAGNOSIS — F17200 Nicotine dependence, unspecified, uncomplicated: Secondary | ICD-10-CM | POA: Insufficient documentation

## 2019-04-21 LAB — GROUP A STREP BY PCR: Group A Strep by PCR: NOT DETECTED

## 2019-04-21 MED ORDER — LIDOCAINE VISCOUS HCL 2 % MT SOLN
15.0000 mL | Freq: Once | OROMUCOSAL | Status: AC
  Administered 2019-04-21: 20:00:00 15 mL via OROMUCOSAL
  Filled 2019-04-21: qty 15

## 2019-04-21 MED ORDER — LIDOCAINE VISCOUS HCL 2 % MT SOLN: 10.0000 mL | OROMUCOSAL | 0 refills | Status: AC | PRN

## 2019-04-21 NOTE — ED Provider Notes (Signed)
[facility] Emergency Department Provider Note  ____________________________________________  Time seen: Approximately 5:53 PM  I have reviewed the triage vital signs and the nursing notes.   HISTORY  Chief Complaint Sore Throat    HPI Felicia Harris is a 28 y.o. female that presents to the emergency department for evaluation of sore throat for 3 days.  Pain is worse with swallowing.  Patient states that her lymph nodes feel swollen.  She has felt warm but does not think that she has had a fever.  She has trouble with her allergies and nasal congestion but this has not changed.  No sick contacts.  No fever, cough, shortness of breath, vomiting, diarrhea.  History reviewed. No pertinent past medical history.  There are no active problems to display for this patient.   Past Surgical History:  Procedure Laterality Date  . TONSILLECTOMY      Prior to Admission medications   Medication Sig Start Date End Date Taking? Authorizing Provider  brompheniramine-pseudoephedrine-DM 30-2-10 MG/5ML syrup Take 5 mLs by mouth 4 (four) times daily as needed. 11/21/18   Tenna Delaine D, PA-C  fluticasone (FLONASE) 50 MCG/ACT nasal spray Place 2 sprays into both nostrils daily. 11/21/18   Tenna Delaine D, PA-C  ipratropium (ATROVENT) 0.03 % nasal spray Place 2 sprays into both nostrils 2 (two) times daily. 11/21/18   Tenna Delaine D, PA-C  lidocaine (XYLOCAINE) 2 % solution Use as directed 10 mLs in the mouth or throat as needed. 04/21/19   Laban Emperor, PA-C    Allergies Bee venom  History reviewed. No pertinent family history.  Social History Social History   Tobacco Use  . Smoking status: Current Every Day Smoker  . Smokeless tobacco: Never Used  Substance Use Topics  . Alcohol use: No  . Drug use: Not on file     Review of Systems  Constitutional: No fever/chills Eyes: No visual changes. No discharge. ENT: Negative for congestion and rhinorrhea.   Positive for sore throat. Cardiovascular: No chest pain. Respiratory: Negative for cough. No SOB. Gastrointestinal: No abdominal pain.  No nausea, no vomiting.  No diarrhea.  No constipation. Musculoskeletal: Negative for musculoskeletal pain. Skin: Negative for rash, abrasions, lacerations, ecchymosis. Neurological: Negative for headaches.   ____________________________________________   PHYSICAL EXAM:  VITAL SIGNS: ED Triage Vitals  Enc Vitals Group     BP 04/21/19 1529 (!) 134/93     Pulse Rate 04/21/19 1529 (!) 115     Resp 04/21/19 1529 18     Temp 04/21/19 1529 98.5 F (36.9 C)     Temp Source 04/21/19 1529 Oral     SpO2 04/21/19 1529 99 %     Weight 04/21/19 1530 235 lb (106.6 kg)     Height 04/21/19 1530 5\' 7"  (1.702 m)     Head Circumference --      Peak Flow --      Pain Score 04/21/19 1530 8     Pain Loc --      Pain Edu? --      Excl. in [place]? --      Constitutional: Alert and oriented. Well appearing and in no acute distress. Eyes: Conjunctivae are normal. PERRL. EOMI. No discharge. Head: Atraumatic. ENT: No frontal and maxillary sinus tenderness.      Ears: Tympanic membranes pearly gray with good landmarks. No discharge.      Nose: No congestion/rhinnorhea.      Mouth/Throat: Mucous membranes are moist. Oropharynx erythematous. Tonsils absent. No exudates.  Uvula midline. Neck: No stridor.   Hematological/Lymphatic/Immunilogical: Anterior cervical lymphadenopathy. Cardiovascular: Normal rate, regular rhythm.  Good peripheral circulation. Respiratory: Normal respiratory effort without tachypnea or retractions. Lungs CTAB. Good air entry to the bases with no decreased or absent breath sounds. Gastrointestinal: Bowel sounds 4 quadrants. Soft and nontender to palpation. No guarding or rigidity. No palpable masses. No distention. Musculoskeletal: Full range of motion to all extremities. No gross deformities appreciated. Neurologic:  Normal speech and language.  No gross focal neurologic deficits are appreciated.  Skin:  Skin is warm, dry and intact. No rash noted. Psychiatric: Mood and affect are normal. Speech and behavior are normal. Patient exhibits appropriate insight and judgement.   ____________________________________________   LABS (all labs ordered are listed, but only abnormal results are displayed)  Labs Reviewed  GROUP A STREP BY PCR  NOVEL CORONAVIRUS, NAA (HOSPITAL ORDER, SEND-OUT TO REF LAB)   ____________________________________________  EKG   ____________________________________________  RADIOLOGY  No results found.  ____________________________________________    PROCEDURES  Procedure(s) performed:    Procedures    Medications  lidocaine (XYLOCAINE) 2 % viscous mouth solution 15 mL (15 mLs Mouth/Throat Given 04/21/19 2010)     ____________________________________________   INITIAL IMPRESSION / ASSESSMENT AND PLAN / ED COURSE  Pertinent labs & imaging results that were available during my care of the patient were reviewed by me and considered in my medical decision making (see chart for details).  Review of the [place] CSRS was performed in accordance of the [facility] prior to dispensing any controlled drugs.     Patient's diagnosis is consistent with viral pharyngitis. Vital signs and exam are reassuring.  Strep throat test is negative.  COVID test ordered and is pending.  Patient appears well and is staying well hydrated.  Patient feels comfortable going home. Patient will be discharged home with prescriptions for viscous lidocaine. Patient is to follow up with primary care and health department as needed or otherwise directed. Patient is given ED precautions to return to the ED for any worsening or new symptoms.  Felicia Harris was evaluated in Emergency Department on 04/21/2019 for the symptoms described in the history of present illness. She was evaluated in the context of the global COVID-19 pandemic, which  necessitated consideration that the patient might be at risk for infection with the SARS-CoV-2 virus that causes COVID-19. Institutional protocols and algorithms that pertain to the evaluation of patients at risk for COVID-19 are in a state of rapid change based on information released by regulatory bodies including the CDC and federal and state organizations. These policies and algorithms were followed during the patient's care in the ED.   ____________________________________________  FINAL CLINICAL IMPRESSION(S) / ED DIAGNOSES  Final diagnoses:  Sore throat      NEW MEDICATIONS STARTED DURING THIS VISIT:  ED Discharge Orders         Ordered    lidocaine (XYLOCAINE) 2 % solution  As needed     04/21/19 2019              This chart was dictated using voice recognition software/Dragon. Despite best efforts to proofread, errors can occur which can change the meaning. Any change was purely unintentional.    Enid DerryWagner, Deondre Marinaro, PA-C 04/21/19 2104    Phineas SemenGoodman, Gray[place], MD 04/21/19 2133

## 2019-04-21 NOTE — ED Notes (Signed)
Reviewed discharge instructions, follow-up care, and prescriptions with patient. Patient verbalized understanding of all information reviewed. Patient stable, with no distress noted at this time.    

## 2019-04-21 NOTE — ED Notes (Signed)
See triage note. Pt denies SOB/CP/fever/N/V/D/HA. Pt specifically reports sore throat and pain upon swallowing.

## 2019-04-21 NOTE — ED Triage Notes (Signed)
Pt to ER reports sore throat and throat swelling for last 3 days.  States worse today.  Pt denies fever or other s/s at this time.

## 2019-04-21 NOTE — Discharge Instructions (Signed)
Your strep test is negative.  You can use viscous lidocaine to help soothe your throat.  Please take Tylenol for any fever.  Your COVID test should result in about 2 days.

## 2019-04-26 LAB — NOVEL CORONAVIRUS, NAA (HOSP ORDER, SEND-OUT TO REF LAB; TAT 18-24 HRS): SARS-CoV-2, NAA: NOT DETECTED

## 2019-04-27 ENCOUNTER — Telehealth: Payer: Self-pay | Admitting: Emergency Medicine

## 2019-04-27 NOTE — Telephone Encounter (Signed)
Patient was informed of negative covid 19 result yesterday by Fletcher Anon

## 2019-09-01 IMAGING — CR DG KNEE COMPLETE 4+V*R*
1 series · 5 of 5 positions shown · non-contrast
Comparison: None.

CLINICAL DATA: 27-year-old female with trauma to the right knee.

EXAM:
RIGHT KNEE - COMPLETE 4+ VIEW

[Series 1: dg knee complete 4 views right · 0.14mm/px · 5 of 5 slices shown]
[im 1/5]
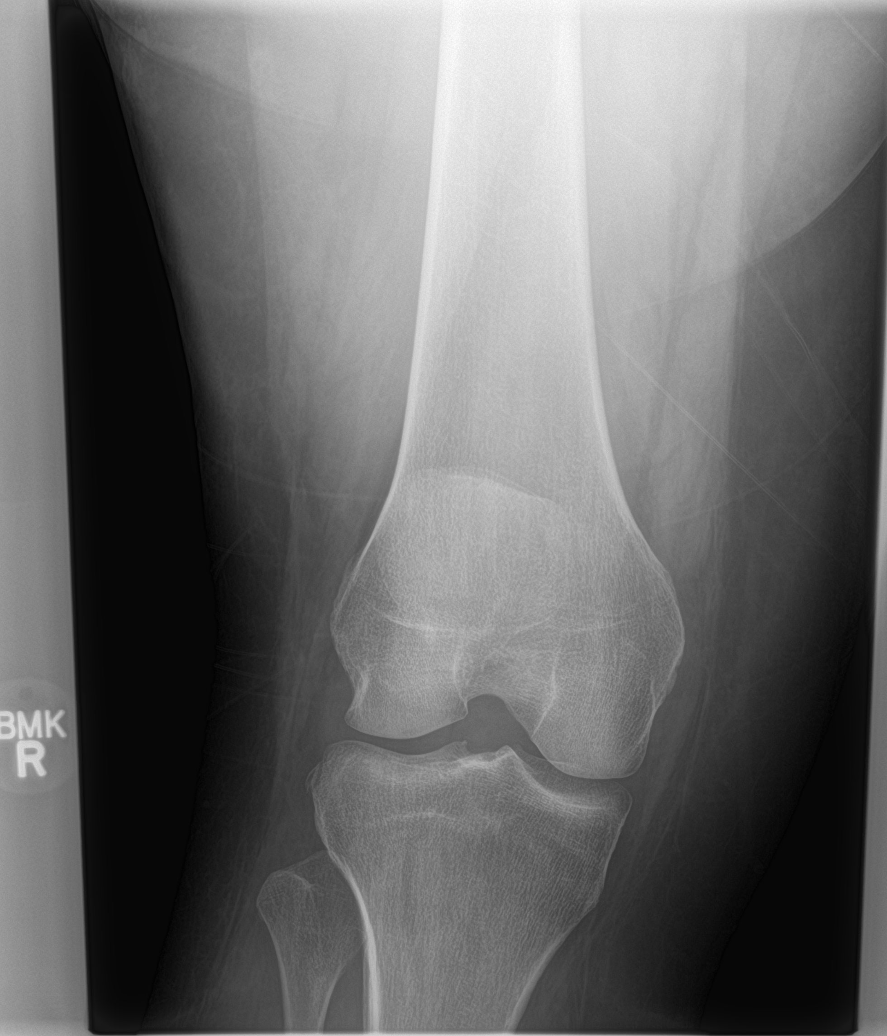
[im 2/5]
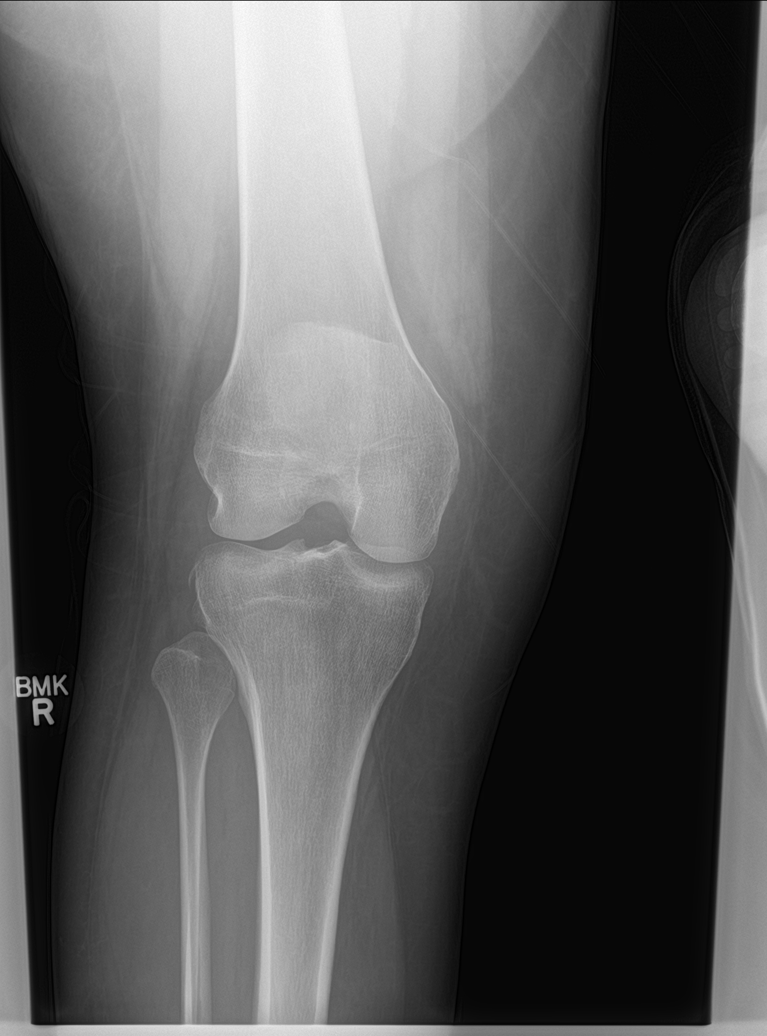
[im 3/5]
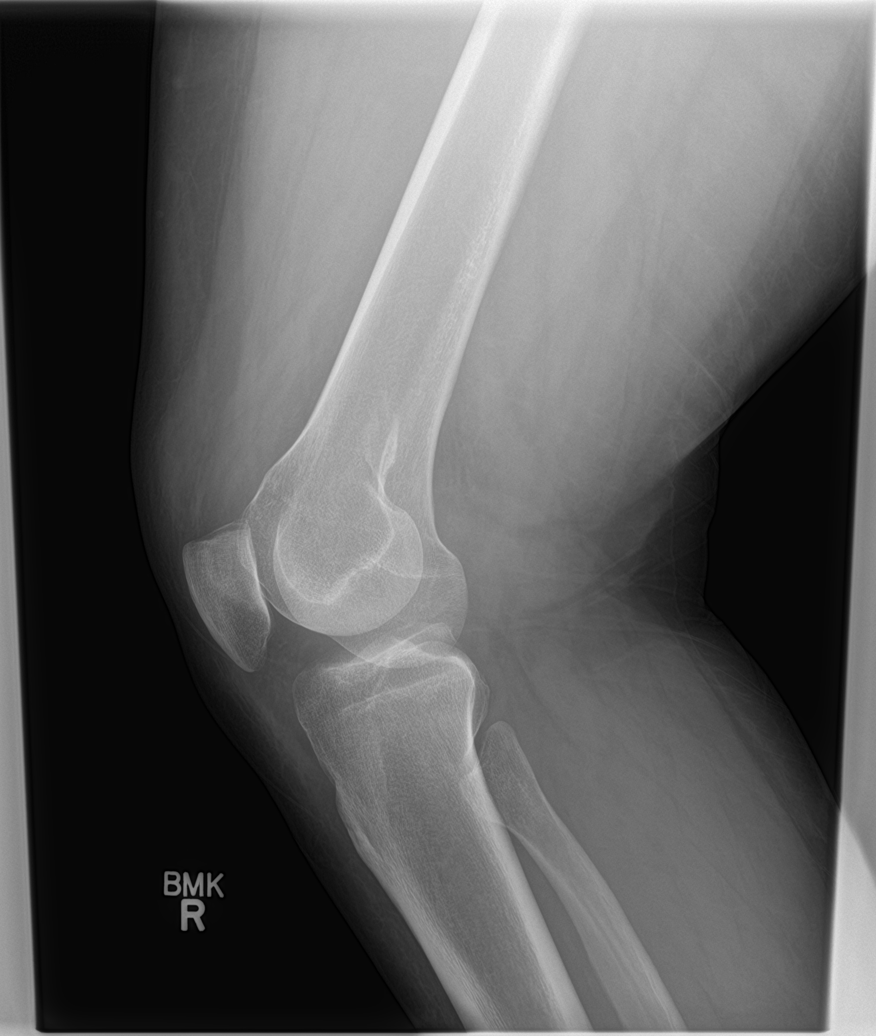
[im 4/5]
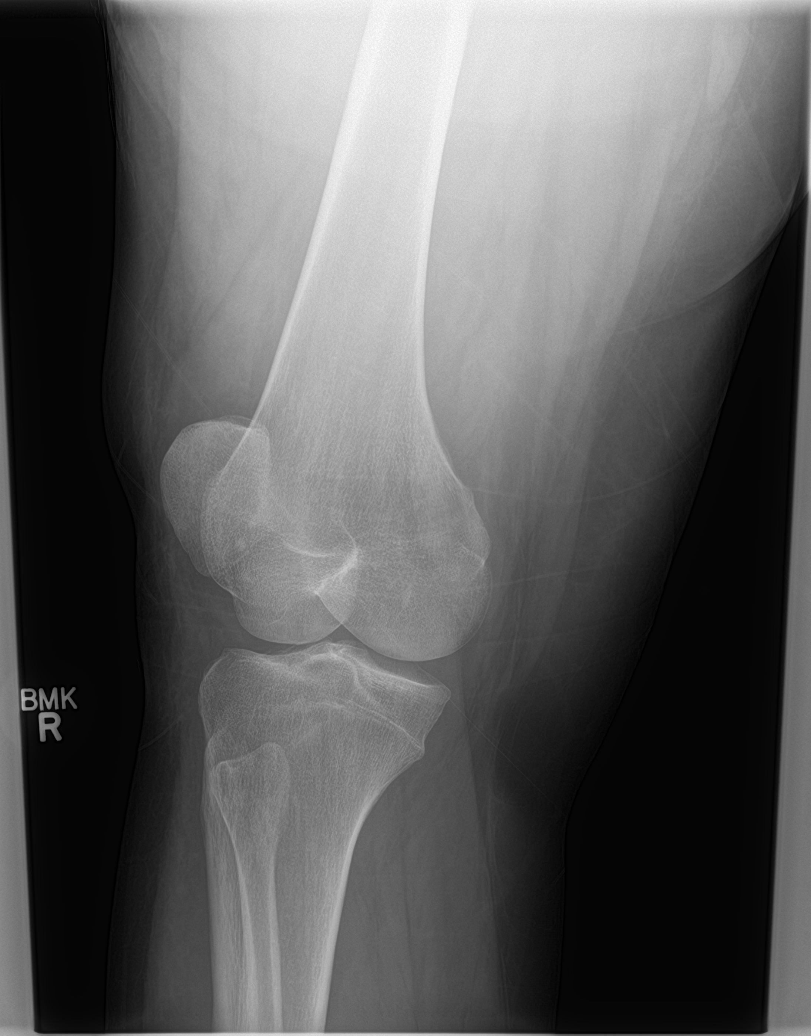
[im 5/5]
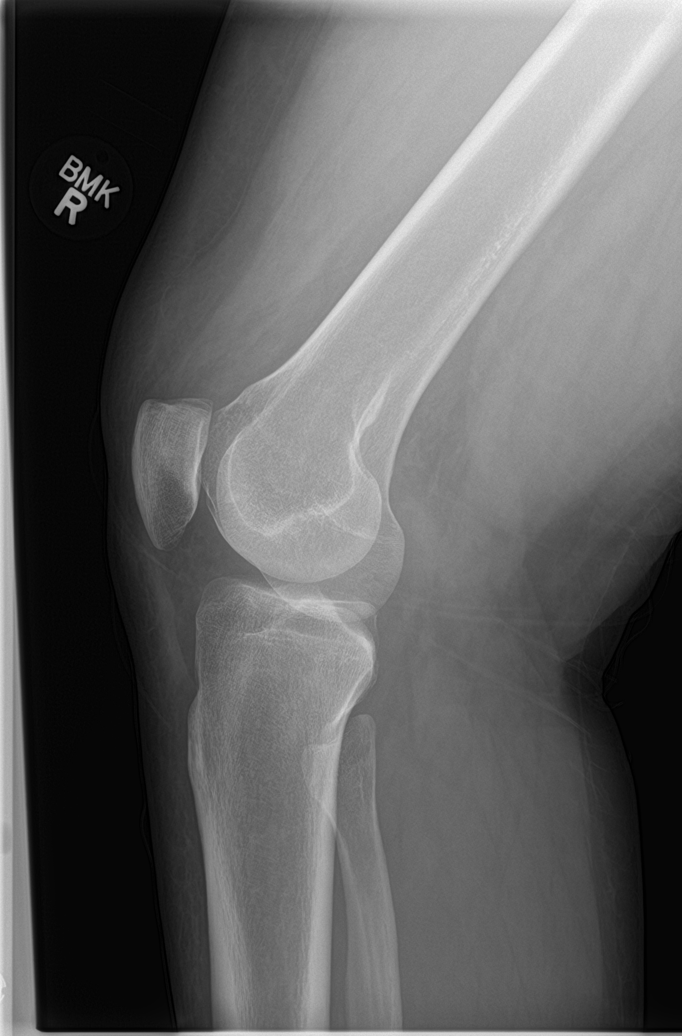

[5 of 5 positions shown; findings below may reference images not displayed]

FINDINGS: Focal area of cortical irregularity along the lateral aspect of the
tibial plateau is age indeterminate and may be chronic although an
acute fracture is not excluded correlation with clinical exam and
point tenderness recommended. No other acute fracture identified.
There is no dislocation. The bones are well mineralized. No
arthritic changes. No significant joint effusion. The soft tissues
appear unremarkable.
IMPRESSION: Focal area of cortical irregularity along the lateral tibial plateau
may represent an age indeterminate fracture. Correlation with
clinical exam and point tenderness over this area recommended.

## 2022-05-04 ENCOUNTER — Ambulatory Visit: Payer: Self-pay | Admitting: Nurse Practitioner
